# Patient Record
Sex: Male | Born: 1985 | Race: White | Hispanic: No | Marital: Married | Smoking: Former smoker
Health system: Southern US, Community
[De-identification: ages and names within clinical notes are randomized; demographics above are authoritative.]

## PROBLEM LIST (undated history)

## (undated) HISTORY — PX: OTHER SURGICAL HISTORY: SHX169

## (undated) HISTORY — PX: APPENDECTOMY: SHX54

---
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
## 2007-01-10 ENCOUNTER — Observation Stay (HOSPITAL_COMMUNITY): Admission: EM | Admit: 2007-01-10 | Discharge: 2007-01-11 | Payer: Self-pay | Admitting: Emergency Medicine

## 2008-12-28 ENCOUNTER — Emergency Department (HOSPITAL_COMMUNITY): Admission: EM | Admit: 2008-12-28 | Discharge: 2008-12-28 | Payer: Self-pay | Admitting: Emergency Medicine

## 2010-02-07 ENCOUNTER — Emergency Department (HOSPITAL_COMMUNITY): Admission: EM | Admit: 2010-02-07 | Discharge: 2010-02-07 | Payer: Self-pay | Admitting: Family Medicine

## 2010-02-26 ENCOUNTER — Emergency Department (HOSPITAL_COMMUNITY): Admission: EM | Admit: 2010-02-26 | Discharge: 2010-02-26 | Payer: Self-pay | Admitting: Emergency Medicine
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
## 2010-12-10 NOTE — Discharge Summary (Signed)
NAME:  Stephen [name], STANDING NO.:  [id number]   MEDICAL RECORD NO.:  [id number]          PATIENT TYPE:  INP   LOCATION:  A321                          FACILITY:  [facility]   PHYSICIAN:  Donna Bernard, M.D.DATE OF BIRTH:  1986/04/07   DATE OF ADMISSION:  12/24/2004  DATE OF DISCHARGE:  06/05/2006LH                                 DISCHARGE SUMMARY   DISCHARGE DIAGNOSIS:  Early appendicitis with mesenteric lymphadenitis.   DISPOSITION:  Discharged to home.   HISTORY OF PRESENT ILLNESS:  Please see H&P as dictated.   HOSPITAL COURSE:  This patient is a 25 year old, white male with history of  benign medical history.  He was doing well until the day prior to admission  when he developed acute abdominal pain.  He also had significant anorexia  and fever.  This was accompanied by significant vomiting.  His pain  localized to his right quadrant and I was consulted.  We admitted the  patient to the hospital.  His white blood count was 19,000 with forward  shift.  CT scan did not reveal exact status of the appendix due to the oral  contrast not reaching that site due to recurrent vomiting.  The patient was  given IV fluids and IV pain medicine.  Dr. Katrinka Blazing was consulted.  The patient  was kept NPO.  IV antibiotics of Cipro and Flagyl at appropriate doses were  administered.  Dr. Katrinka Blazing felt the patient likely had an appendicitis.  He  received an appendectomy as noted.  The appendix on pathological exam showed  evidence of early inflammation.  On the date of discharge, the patient was  feeling better.  His white blood count had normalized.  It was felt that he  was stable for discharge and was discharged home.  Diagnosis and disposition  as noted above.       WSL/MEDQ  D:  01/26/2005  T:  01/26/2005  Job:  [id number]

## 2010-12-10 NOTE — H&P (Signed)
NAME:  Stephen, [name] NO.:  [id number]   MEDICAL RECORD NO.:  [id number]          PATIENT TYPE:  INP   LOCATION:  A321                          FACILITY:  [facility]   PHYSICIAN:  Donna Bernard, M.D.DATE OF BIRTH:  Sep 07, 1985   DATE OF ADMISSION:  12/24/2004  DATE OF DISCHARGE:  LH                                HISTORY & PHYSICAL   CHIEF COMPLAINT:  Abdominal pain and vomiting.   SUBJECTIVE:  This patient is a 25 year old white male with a benign current  medical history, presented to the emergency room on the day of admission  with acute complaints.  The day prior to admission, the patient noted the  development of headache and abdominal pain.  Soon after that he began  developing vomiting.  He pretty much vomited off and on throughout the  night.  The morning of admission, he notes anorexia.  He has had no  diarrhea.  He has noted some low-grade fever with a maximum temperature  around 101.  The patient has had no back pain.  The initial headache has  dissipated.  The patient states he has no decent appetite and there has been  no one else sick at home.  No current medications.  The patient is currently  in school, finishing up the year.   ALLERGIES:  None known.   CURRENT MEDICATIONS:  No current medications.   SURGICAL HISTORY:  No prior surgeries.   FAMILY HISTORY:  Noncontributory.   REVIEW OF SYSTEMS:  Otherwise negative.   VITAL SIGNS:  Temperature 100.  The patient is alert, moderate malaise,  blood pressure 122/70.   PHYSICAL EXAMINATION:  HEENT:  Normal.  NECK:  Supple.  LUNGS:  Clear.  HEART:  Regular rate and rhythm.  ABDOMINAL EXAM:  Good bowel sounds, diffuse upper abdominal tenderness,  significant lower abdominal tenderness, no CVA tenderness.  EXTREMITIES:  Normal.  Pulse is good.   SIGNIFICANT LABORATORIES:  White blood count 19,000 with a left shift.  CT scan did not reveal exact status of appendix due to oral contrast not  reaching that site due to recurrent vomiting.   IMPRESSION:  Appendicitis versus a viral syndrome.   PLAN:  Surgery consult.  I had talked to rotation Dr. Katrinka Blazing.  IV fluids, IV  antibiotics, IV pain control, nausea control, symptomatic care.       WSL/MEDQ  D:  12/25/2004  T:  12/25/2004  Job:  [id number]
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
## 2010-12-10 NOTE — Op Note (Signed)
NAME:  Stephen [name], Stephen [name]              ACCOUNT NO.:  [id number]   MEDICAL RECORD NO.:  [id number]          PATIENT TYPE:  INP   LOCATION:  A321                          FACILITY:  [facility]   PHYSICIAN:  Dirk Dress. Katrinka Blazing, M.D.   DATE OF BIRTH:  [birth year]   DATE OF PROCEDURE:  DATE OF DISCHARGE:                                 OPERATIVE REPORT   PREOPERATIVE DIAGNOSIS:  Acute abdomen, rule out appendicitis.   POSTOPERATIVE DIAGNOSIS:  Mesenteric adenitis with probably early  appendicitis.   PROCEDURE:  Diagnostic laparoscopy with appendectomy.   SURGEON:  Dirk Dress. Katrinka Blazing, M.D.   DESCRIPTION:  Under general anesthesia, the patient's abdomen was prepped  and draped in the sterile field.  A supraumbilical midline incision was made  and the Veress needle was inserted uneventfully.  The abdomen was  insufflated with 2 L of CO2.  Using a Visiport guide, a 10 mm port was  placed.  The laparoscope was placed uneventfully.  Under videoscopic  guidance, a 5 mm port was placed in the suprapubic midline and a 12 mm port  was placed in the left lower quadrant.  The bowel appeared to be slightly  edematous and erythematous with most of the erythema appearing to be in the  terminal ileum.  There were large nodes in the ileocolic mesentery.  The  appendix was slightly engorged with increased vascularity and inflammation  of the serosa but without any purulence.  The mesoappendix was unremarkable.  The pelvis was unremarkable.  The upper abdomen appeared to be normal.  The  mesoappendix was dissected.  It was slightly thickened, so it was dissected  in three sections.  It was transected using the vascular Endo-GIA using  three fires.  The base of the appendix was transected using the vascular  Endo-GIA.  The staple line appeared to be intact.  The appendix was placed  in the EndoCatch device and retrieved.  Further irrigation was carried out  until the fluid was clear.  The pelvis was inspected and there was  some  small amount of bloody fluid, which was irrigated until clear.  No other  abnormality was noted.  The patient tolerated the procedure well.  CO2 was  allowed to escape from the abdomen and ports were removed.  The  supraumbilical incision was  closed with 0 Vicryl on the fascia.  All incisions were closed with staples  on the skin.  The patient tolerated the procedure well.  He was awakened  from anesthesia uneventfully, transferred to a bed and taken to the  postanesthetic care unit for monitoring.       LCS/MEDQ  D:  12/25/2004  T:  12/26/2004  Job:  [id number]   cc:   Donna Bernard, M.D.  [address]  [place]  [place] [address]  Fax: [phone]

## 2011-05-11 LAB — URINALYSIS, ROUTINE W REFLEX MICROSCOPIC
Glucose, UA: NEGATIVE
Leukocytes, UA: NEGATIVE
Nitrite: NEGATIVE
Protein, ur: 30 — AB
Specific Gravity, Urine: 1.03
Urobilinogen, UA: 1

## 2011-05-11 LAB — I-STAT 8, (EC8 V) (CONVERTED LAB)
Acid-base deficit: 1
BUN: 15
Glucose, Bld: 122 — ABNORMAL HIGH
HCT: 50
Hemoglobin: 17
Operator id: 277751
Sodium: 141

## 2011-05-11 LAB — URINE MICROSCOPIC-ADD ON

## 2011-05-11 LAB — POCT I-STAT CREATININE
Creatinine, Ser: 1.1
Operator id: 277751

## 2011-05-11 LAB — HEMOGLOBIN: Hemoglobin: 13.5

## 2012-02-18 ENCOUNTER — Emergency Department (HOSPITAL_COMMUNITY)
Admission: EM | Admit: 2012-02-18 | Discharge: 2012-02-18 | Disposition: A | Discharge status: Home / Self Care | Payer: BC Managed Care – PPO | Attending: Emergency Medicine | Admitting: Emergency Medicine

## 2012-02-18 ENCOUNTER — Encounter (HOSPITAL_COMMUNITY): Payer: Self-pay | Admitting: Emergency Medicine

## 2012-02-18 DIAGNOSIS — F17200 Nicotine dependence, unspecified, uncomplicated: Secondary | ICD-10-CM | POA: Insufficient documentation

## 2012-02-18 DIAGNOSIS — K089 Disorder of teeth and supporting structures, unspecified: Secondary | ICD-10-CM | POA: Insufficient documentation

## 2012-02-18 DIAGNOSIS — K0889 Other specified disorders of teeth and supporting structures: Secondary | ICD-10-CM

## 2012-02-18 MED ORDER — HYDROCODONE-ACETAMINOPHEN 5-325 MG PO TABS: 1.0000 | ORAL_TABLET | Freq: Four times a day (QID) | ORAL | Status: AC | PRN

## 2012-02-18 MED ORDER — PENICILLIN V POTASSIUM 500 MG PO TABS: 500.0000 mg | ORAL_TABLET | Freq: Four times a day (QID) | ORAL | Status: AC

## 2012-02-18 MED ORDER — OXYCODONE-ACETAMINOPHEN 5-325 MG PO TABS
1.0000 | ORAL_TABLET | Freq: Once | ORAL | Status: AC
  Administered 2012-02-18: 1 via ORAL
  Filled 2012-02-18: qty 1

## 2012-02-18 NOTE — ED Provider Notes (Cosign Needed)
History     CSN: [id number]  Arrival date & time 02/18/12  0327   First MD Initiated Contact with Patient 02/18/12 0421      Chief Complaint  Patient presents with  . Dental Pain    (Consider location/radiation/quality/duration/timing/severity/associated sxs/prior treatment) Patient is a 26 y.o. male presenting with tooth pain. The history is provided by the patient (pt complains of a toothache). No language interpreter was used.  Dental PainThe primary symptoms include mouth pain. Primary symptoms do not include dental injury, headaches or cough. The symptoms began more than 1 week ago. The symptoms are worsening. The symptoms are recurrent. The symptoms occur frequently.  Additional symptoms do not include: gum swelling and fatigue. Medical issues do not include: alcohol problem.    History reviewed. No pertinent past medical history.  Past Surgical History  Procedure Date  . Appendectomy   . Leg operation     History reviewed. No pertinent family history.  History  Substance Use Topics  . Smoking status: Current Some Day Smoker  . Smokeless tobacco: Not on file  . Alcohol Use: Yes     occasionally      Review of Systems  Constitutional: Negative for fatigue.  HENT: Negative for congestion, sinus pressure and ear discharge.        Toothache  Eyes: Negative for discharge.  Respiratory: Negative for cough.   Cardiovascular: Negative for chest pain.  Gastrointestinal: Negative for abdominal pain and diarrhea.  Genitourinary: Negative for frequency and hematuria.  Musculoskeletal: Negative for back pain.  Skin: Negative for rash.  Neurological: Negative for seizures and headaches.  Hematological: Negative.   Psychiatric/Behavioral: Negative for hallucinations.    Allergies  Review of patient's allergies indicates no known allergies.  Home Medications   Current Outpatient Rx  Name Route Sig Dispense Refill  . HYDROCODONE-ACETAMINOPHEN 5-325 MG PO TABS Oral  Take 1 tablet by mouth every 6 (six) hours as needed for pain. 20 tablet 0  . PENICILLIN V POTASSIUM 500 MG PO TABS Oral Take 1 tablet (500 mg total) by mouth 4 (four) times daily. 30 tablet 0    BP 130/83  Pulse 55  Temp 97.3 F (36.3 C) (Oral)  Resp 18  Ht 6\' 1"  (1.854 m)  Wt 195 lb (88.451 kg)  BMI 25.73 kg/m2  SpO2 100%  Physical Exam  Constitutional: He is oriented to person, place, and time. He appears well-developed.  HENT:  Head: Normocephalic.       Tender right lower molar  Eyes: Conjunctivae are normal.  Neck: No tracheal deviation present.  Cardiovascular:  No murmur heard. Musculoskeletal: Normal range of motion.  Neurological: He is oriented to person, place, and time.  Skin: Skin is warm.  Psychiatric: He has a normal mood and affect.    ED Course  Procedures (including critical care time)  Labs Reviewed - No data to display No results found.   1. Toothache       MDM          Benny Lennert, MD 02/18/12 [phone]

## 2012-02-18 NOTE — ED Notes (Signed)
Patient complaining of toothache. States has been bothering him for approximately 2 weeks. States cannot take the pain tonight.

## 2012-02-18 NOTE — ED Notes (Signed)
Pt discharged. Pt stable at time of discharge. Medications reviewed pt has no questions regarding discharge at this time. Pt voiced understanding of discharge instructions.  

## 2018-04-15 ENCOUNTER — Encounter (HOSPITAL_COMMUNITY): Payer: Self-pay | Admitting: Emergency Medicine

## 2018-04-15 ENCOUNTER — Emergency Department (HOSPITAL_COMMUNITY)
Admission: EM | Admit: 2018-04-15 | Discharge: 2018-04-15 | Disposition: A | Discharge status: Home / Self Care | Payer: BLUE CROSS/BLUE SHIELD | Attending: Emergency Medicine | Admitting: Emergency Medicine

## 2018-04-15 ENCOUNTER — Emergency Department (HOSPITAL_COMMUNITY): Payer: BLUE CROSS/BLUE SHIELD

## 2018-04-15 ENCOUNTER — Other Ambulatory Visit: Payer: Self-pay

## 2018-04-15 DIAGNOSIS — F17210 Nicotine dependence, cigarettes, uncomplicated: Secondary | ICD-10-CM | POA: Diagnosis not present

## 2018-04-15 DIAGNOSIS — M778 Other enthesopathies, not elsewhere classified: Secondary | ICD-10-CM

## 2018-04-15 DIAGNOSIS — M779 Enthesopathy, unspecified: Secondary | ICD-10-CM

## 2018-04-15 DIAGNOSIS — M70841 Other soft tissue disorders related to use, overuse and pressure, right hand: Secondary | ICD-10-CM | POA: Insufficient documentation

## 2018-04-15 DIAGNOSIS — Y93H3 Activity, building and construction: Secondary | ICD-10-CM | POA: Insufficient documentation

## 2018-04-15 DIAGNOSIS — M25531 Pain in right wrist: Secondary | ICD-10-CM | POA: Diagnosis present

## 2018-04-15 MED ORDER — IBUPROFEN 800 MG PO TABS
800.0000 mg | ORAL_TABLET | Freq: Once | ORAL | Status: AC
  Administered 2018-04-15: 800 mg via ORAL
  Filled 2018-04-15: qty 1

## 2018-04-15 MED ORDER — IBUPROFEN 600 MG PO TABS: 600.0000 mg | ORAL_TABLET | Freq: Three times a day (TID) | ORAL | 0 refills | Status: AC

## 2018-04-15 NOTE — Discharge Instructions (Signed)
I recommend using just ice for the next 2-3 days to help reduce swelling, after which you can use a heating pad for 20 minutes several times daily which can help to heal this tendon more quickly. Avoid any activity that worsens the pain or swelling and use the splint until better.  If your symptoms are not improving over the the next 10-14 days with this treatment, you should have further evaluation by an orthopedic specialist.  Dr. Ave Filterhandler is the orthopedic specialist on call for us.  If you prefer to see someone locally in [place], you may try calling Dr Romeo AppleHarrison.

## 2018-04-15 NOTE — ED Triage Notes (Signed)
Pt c/o R wrist pain and swelling for several days. Denies injuries.

## 2018-04-16 NOTE — ED Provider Notes (Signed)
[facility] EMERGENCY DEPARTMENT Provider Note   CSN: [id number] Arrival date & time: 04/15/18  2024     History   Chief Complaint Chief Complaint  Patient presents with  . Wrist Pain    HPI Stephen [name] is a 32 y.o.right handed male with no significant past medical history presenting with right wrist pain and swelling. He denies specific injury but describes possible overuse. 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  He has used ice and rest over the weekend with no improvement. His pain is worsened with wrist and finger flex/ext, with the pain being the worst with wrist extension and thumb and index movement.   The history is provided by the patient.    History reviewed. No pertinent past medical history.  There are no active problems to display for this patient.   Past Surgical History:  Procedure Laterality Date  . APPENDECTOMY    . leg operation          Home Medications    Prior to Admission medications   Medication Sig Start Date End Date Taking? Authorizing Provider  acetaminophen (TYLENOL) 500 MG tablet Take 1,000 mg by mouth every 6 (six) hours as needed for mild pain.   Yes [provider]  Melatonin 10 MG TABS Take 2 tablets by mouth at bedtime as needed (sleep).   Yes [provider]  ibuprofen (ADVIL,MOTRIN) 600 MG tablet Take 1 tablet (600 mg total) by mouth 3 (three) times daily. 04/15/18   Burgess AmorIdol, Zamaya Rapaport, PA-C    Family History History reviewed. No pertinent family history.  Social History Social History   Tobacco Use  . Smoking status: Current Some Day Smoker  . Smokeless tobacco: Current User    Types: Chew  Substance Use Topics  . Alcohol use: Yes    Comment: occasionally  . Drug use: Not Currently     Allergies   Patient has no known  allergies.   Review of Systems Review of Systems  Constitutional: Negative for fever.  Musculoskeletal: Positive for arthralgias. Negative for joint swelling and myalgias.  Neurological: Negative for weakness and numbness.     Physical Exam Updated Vital Signs BP (!) 147/110 (BP Location: Left Arm)   Pulse 79   Temp 98.1 F (36.7 C) (Oral)   Resp 18   Ht 6' (1.829 m)   Wt 98 kg   SpO2 100%   BMI 29.29 kg/m   Physical Exam  Constitutional: He appears well-developed and well-nourished.  HENT:  Head: Atraumatic.  Cardiovascular: Normal rate.  Pulses equal bilaterally  Pulmonary/Chest: Effort normal.  Musculoskeletal: He exhibits edema and tenderness.       Right wrist: He exhibits decreased range of motion, tenderness and swelling. He exhibits no effusion, no crepitus and no deformity.  ttp right dorsal wrist. Localized soft edema which is tender and located radial dorsal wrist along the distal radius. Pain with active and passive wrist and finger flex/ext. No erythema. Skin intact. Radial pulse full.   No crepitus with ROM.  Neurological: He is alert. He has normal strength. He displays normal reflexes. No sensory deficit.  Skin: Skin is warm and dry.  Psychiatric: He has a normal mood and affect.     ED Treatments / Results  Labs (all labs ordered are  listed, but only abnormal results are displayed) Labs Reviewed - No data to display  EKG None  Radiology Dg Wrist Complete Right  Result Date: 04/15/2018 CLINICAL DATA:  Right wrist pain and swelling. EXAM: RIGHT WRIST - COMPLETE 3+ VIEW COMPARISON:  None. FINDINGS: There is no evidence of fracture or dislocation. There is no evidence of arthropathy or other focal bone abnormality. Soft tissues are unremarkable. IMPRESSION: Negative. Electronically Signed   By: Kennith Center M.D.   On: 04/15/2018 21:01    Procedures Procedures (including critical care time)  Medications Ordered in ED Medications  ibuprofen  (ADVIL,MOTRIN) tablet 800 mg (800 mg Oral Given 04/15/18 2215)     Initial Impression / Assessment and Plan / ED Course  I have reviewed the triage vital signs and the nursing notes.  Pertinent labs & imaging results that were available during my care of the patient were reviewed by me and considered in my medical decision making (see chart for details).     Images reviewed and discussed with pt. Exam and h/o suggesting extensor tendinitis of right wrist/hand. Localized soft focal edema with distinct borders suggesting cyst like lesion/possible ganglion of the tendon or sheath.  He was placed in a velcro wrist splint to immobilize the site. Ice, elevation, ibuprofen. Referral to hand specialty for further eval/management if sx do not resolve with todays tx.  Pt understands and agrees with plan.  Final Clinical Impressions(s) / ED Diagnoses   Final diagnoses:  Tendinitis of extensor tendon of hand    ED Discharge Orders         Ordered    ibuprofen (ADVIL,MOTRIN) 600 MG tablet  3 times daily     04/15/18 2205           Burgess Amor, PA-C 04/16/18 1410    Samuel Jester, DO 04/18/18 1558

## 2020-01-26 ENCOUNTER — Encounter (HOSPITAL_COMMUNITY): Payer: Self-pay | Admitting: Emergency Medicine

## 2020-01-26 ENCOUNTER — Emergency Department (HOSPITAL_COMMUNITY)
Admission: EM | Admit: 2020-01-26 | Discharge: 2020-01-26 | Disposition: A | Discharge status: Home / Self Care | Payer: BC Managed Care – PPO | Attending: Emergency Medicine | Admitting: Emergency Medicine

## 2020-01-26 ENCOUNTER — Emergency Department (HOSPITAL_COMMUNITY): Payer: BC Managed Care – PPO

## 2020-01-26 DIAGNOSIS — G5622 Lesion of ulnar nerve, left upper limb: Secondary | ICD-10-CM

## 2020-01-26 DIAGNOSIS — R202 Paresthesia of skin: Secondary | ICD-10-CM | POA: Diagnosis present

## 2020-01-26 DIAGNOSIS — F17210 Nicotine dependence, cigarettes, uncomplicated: Secondary | ICD-10-CM | POA: Diagnosis not present

## 2020-01-26 DIAGNOSIS — R791 Abnormal coagulation profile: Secondary | ICD-10-CM | POA: Diagnosis not present

## 2020-01-26 DIAGNOSIS — Z79899 Other long term (current) drug therapy: Secondary | ICD-10-CM | POA: Insufficient documentation

## 2020-01-26 LAB — DIFFERENTIAL
Abs Immature Granulocytes: 0.04 10*3/uL (ref 0.00–0.07)
Basophils Absolute: 0.1 10*3/uL (ref 0.0–0.1)
Basophils Relative: 1 %
Eosinophils Absolute: 0.3 10*3/uL (ref 0.0–0.5)
Eosinophils Relative: 2 %
Immature Granulocytes: 0 %
Lymphocytes Relative: 29 %
Lymphs Abs: 3.7 10*3/uL (ref 0.7–4.0)
Monocytes Absolute: 1 10*3/uL (ref 0.1–1.0)
Monocytes Relative: 8 %
Neutro Abs: 7.9 10*3/uL — ABNORMAL HIGH (ref 1.7–7.7)
Neutrophils Relative %: 60 %

## 2020-01-26 LAB — I-STAT CHEM 8, ED
BUN: 17 mg/dL (ref 6–20)
Calcium, Ion: 1.24 mmol/L (ref 1.15–1.40)
Chloride: 105 mmol/L (ref 98–111)
Creatinine, Ser: 1.3 mg/dL — ABNORMAL HIGH (ref 0.61–1.24)
Glucose, Bld: 107 mg/dL — ABNORMAL HIGH (ref 70–99)
HCT: 50 % (ref 39.0–52.0)
Hemoglobin: 17 g/dL (ref 13.0–17.0)
Potassium: 4.1 mmol/L (ref 3.5–5.1)
Sodium: 142 mmol/L (ref 135–145)
TCO2: 27 mmol/L (ref 22–32)

## 2020-01-26 LAB — COMPREHENSIVE METABOLIC PANEL
ALT: 54 U/L — ABNORMAL HIGH (ref 0–44)
AST: 44 U/L — ABNORMAL HIGH (ref 15–41)
Albumin: 4.5 g/dL (ref 3.5–5.0)
Alkaline Phosphatase: 70 U/L (ref 38–126)
Anion gap: 13 (ref 5–15)
BUN: 14 mg/dL (ref 6–20)
CO2: 22 mmol/L (ref 22–32)
Calcium: 10 mg/dL (ref 8.9–10.3)
Chloride: 105 mmol/L (ref 98–111)
Creatinine, Ser: 1.21 mg/dL (ref 0.61–1.24)
GFR calc Af Amer: >60 mL/min (ref >60)
GFR calc non Af Amer: >60 mL/min (ref >60)
Glucose, Bld: 111 mg/dL — ABNORMAL HIGH (ref 70–99)
Potassium: 4.4 mmol/L (ref 3.5–5.1)
Sodium: 140 mmol/L (ref 135–145)
Total Bilirubin: 0.8 mg/dL (ref 0.3–1.2)
Total Protein: 7.6 g/dL (ref 6.5–8.1)

## 2020-01-26 LAB — CBC
HCT: 52 % (ref 39.0–52.0)
Hemoglobin: 17.2 g/dL — ABNORMAL HIGH (ref 13.0–17.0)
MCH: 30.7 pg (ref 26.0–34.0)
MCHC: 33.1 g/dL (ref 30.0–36.0)
MCV: 92.9 fL (ref 80.0–100.0)
Platelets: 353 10*3/uL (ref 150–400)
RBC: 5.6 MIL/uL (ref 4.22–5.81)
RDW: 11.5 % (ref 11.5–15.5)
WBC: 13 10*3/uL — ABNORMAL HIGH (ref 4.0–10.5)
nRBC: 0 % (ref 0.0–0.2)

## 2020-01-26 LAB — PROTIME-INR
INR: 1 (ref 0.8–1.2)
Prothrombin Time: 13.2 seconds (ref 11.4–15.2)

## 2020-01-26 LAB — APTT: aPTT: 32 seconds (ref 24–36)

## 2020-01-26 MED ORDER — IBUPROFEN 400 MG PO TABS
600.0000 mg | ORAL_TABLET | Freq: Once | ORAL | Status: AC
  Administered 2020-01-26: 600 mg via ORAL
  Filled 2020-01-26: qty 1

## 2020-01-26 NOTE — ED Triage Notes (Signed)
Pt arrives POV for eval of L forearm weakness and L hand numbness, acute onset at 1530 this afternoon. Pt reports hx of longstanding L elbow injury, but states that he has never felt the pain "this far down in his arm". Reports fingers are tingling and when he makes a fist the pain shoots "all the way up to his shoulderblade". No appreciable weakness, facial droop, arm drift or neuro deficits on exam in triage. MD Criss Alvine examined in triage and determined NO code stroke at time of triage.

## 2020-01-26 NOTE — ED Provider Notes (Signed)
[facility] EMERGENCY DEPARTMENT Provider Note   CSN: [id number] Arrival date & time: 01/26/20  1754     History Chief Complaint  Patient presents with  . Extremity Weakness    Stephen [name] is a 34 y.o. male.  HPI 34 year old male presents with left elbow pain and tingling/numbness in his arm.  This has been ongoing for about a month or more.  Seem to be a lot worse today which is what prompted the ER visit.  Because of the numbness/tingling, a stroke work-up was started by triage.  The patient endorses pain shooting from his elbow down to his fingers mostly on the ulnar side.  There is also tingling/numbness.  Today however it is also shooting up towards his elbow.  No neck pain or specific shoulder pain.  No other symptoms anywhere else.  The tingling and numbness in his hand and elbow pain have been ongoing for a month or so and seems to come and go.  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  He has been dropping things out of his hand.  History reviewed. No pertinent past medical history.  There are no problems to display for this patient.   Past Surgical History:  Procedure Laterality Date  . APPENDECTOMY    . leg operation         History reviewed. No pertinent family history.  Social History   Tobacco Use  . Smoking status: Current Some Day Smoker  . Smokeless tobacco: Current User    Types: Chew  Substance Use Topics  . Alcohol use: Yes    Comment: occasionally  . Drug use: Not Currently    Home Medications Prior to Admission medications   Medication Sig Start Date End Date Taking? Authorizing Provider  acetaminophen (TYLENOL) 500 MG tablet Take 1,000 mg by mouth every 6 (six) hours as needed for mild pain.    [provider]  ibuprofen (ADVIL,MOTRIN) 600 MG tablet Take 1 tablet (600 mg total) by mouth 3 (three) times daily. 04/15/18   Burgess Amor, PA-C  Melatonin 10 MG TABS Take 2 tablets by  mouth at bedtime as needed (sleep).    [provider]    Allergies    Patient has no known allergies.  Review of Systems   Review of Systems  Musculoskeletal: Positive for arthralgias. Negative for joint swelling and neck pain.  Neurological: Positive for weakness and numbness. Negative for headaches.  All other systems reviewed and are negative.   Physical Exam Updated Vital Signs BP (!) 154/110 (BP Location: Right Arm)   Pulse 91   Temp 97.9 F (36.6 C) (Oral)   Resp 16   Ht 6' (1.829 m)   Wt 99 kg   SpO2 97%   BMI 29.60 kg/m   Physical Exam Vitals and nursing note reviewed.  Constitutional:      Appearance: He is well-developed.  HENT:     Head: Normocephalic and atraumatic.     Right Ear: External ear normal.     Left Ear: External ear normal.     Nose: Nose normal.  Eyes:     General:        Right eye: No discharge.        Left eye: No discharge.  Cardiovascular:     Rate and Rhythm: Normal rate and regular rhythm.     Pulses:          Radial pulses are  2+ on the left side.  Pulmonary:     Effort: Pulmonary effort is normal.  Abdominal:     Palpations: Abdomen is soft.     Tenderness: There is no abdominal tenderness.  Musculoskeletal:     Left shoulder: No tenderness or bony tenderness.     Left elbow: No swelling. Normal range of motion. Tenderness present.     Cervical back: Neck supple.     Comments: Patient reports tingling in the palmar left ulnar fingers/palm. Normal strength on exam.  Skin:    General: Skin is warm and dry.  Neurological:     Mental Status: He is alert.  Psychiatric:        Mood and Affect: Mood is not anxious.     ED Results / Procedures / Treatments   Labs (all labs ordered are listed, but only abnormal results are displayed) Labs Reviewed  CBC - Abnormal; Notable for the following components:      Result Value   WBC 13.0 (*)    Hemoglobin 17.2 (*)    All other components within normal limits    DIFFERENTIAL - Abnormal; Notable for the following components:   Neutro Abs 7.9 (*)    All other components within normal limits  COMPREHENSIVE METABOLIC PANEL - Abnormal; Notable for the following components:   Glucose, Bld 111 (*)    AST 44 (*)    ALT 54 (*)    All other components within normal limits  I-STAT CHEM 8, ED - Abnormal; Notable for the following components:   Creatinine, Ser 1.30 (*)    Glucose, Bld 107 (*)    All other components within normal limits  PROTIME-INR  APTT  CBG MONITORING, ED    EKG EKG Interpretation  Date/Time:  Sunday January 26 2020 18:03:31 EDT Ventricular Rate:  95 PR Interval:  94 QRS Duration: 92 QT Interval:  334 QTC Calculation: 419 R Axis:   53 Text Interpretation: Sinus rhythm with marked sinus arrhythmia with short PR no acute ST/T changes Confirmed by Pricilla Loveless [phone]) on 01/26/2020 7:18:07 PM   Radiology DG Elbow Complete Left  Result Date: 01/26/2020 CLINICAL DATA:  Left elbow pain. EXAM: LEFT ELBOW - COMPLETE 3+ VIEW COMPARISON:  Elbow radiographs dated 05/31/2004. FINDINGS: There is no evidence of fracture, dislocation, or joint effusion. There is a tiny enthesophyte on the olecranon. Soft tissues are unremarkable. IMPRESSION: Tiny enthesophyte on the olecranon. No acute findings. Electronically Signed   By: Romona Curls M.D.   On: 01/26/2020 20:21   CT HEAD WO CONTRAST  Result Date: 01/26/2020 CLINICAL DATA:  34 year old male with neurologic deficit and left arm numbness. EXAM: CT HEAD WITHOUT CONTRAST TECHNIQUE: Contiguous axial images were obtained from the base of the skull through the vertex without intravenous contrast. COMPARISON:  Head CT report dated 01/10/2007. FINDINGS: Brain: The ventricles and sulci appropriate size for patient's age. The gray-white matter discrimination is preserved. There is no acute intracranial hemorrhage. No mass effect or midline shift. No extra-axial fluid collection. Vascular: No hyperdense  vessel or unexpected calcification. Skull: Normal. Negative for fracture or focal lesion. Sinuses/Orbits: No acute finding. Other: None IMPRESSION: Unremarkable noncontrast CT of the brain. Electronically Signed   By: Elgie Collard M.D.   On: 01/26/2020 19:05    Procedures Procedures (including critical care time)  Medications Ordered in ED Medications  ibuprofen (ADVIL) tablet 600 mg (600 mg Oral Given 01/26/20 1959)    ED Course  I have reviewed the triage vital  signs and the nursing notes.  Pertinent labs & imaging results that were available during my care of the patient were reviewed by me and considered in my medical decision making (see chart for details).    MDM Rules/Calculators/A&P                          Patient likely is having ulnar entrapment symptoms.  Small enthesophyte but otherwise benign x-ray.  He had had a stroke work-up by triage but this seems to be a peripheral problem.  These labs and CT images were reviewed and are benign.He does have mild LFT abnormalities and states he drinks a fair amount and he was advised to cut back.  Otherwise, we have discussed some conservative measures and will have him follow-up with orthopedics. Final Clinical Impression(s) / ED Diagnoses Final diagnoses:  Ulnar neuropathy at elbow of left upper extremity    Rx / DC Orders ED Discharge Orders    None       Pricilla Loveless, MD 01/26/20 2039

## 2020-02-21 ENCOUNTER — Encounter: Payer: Self-pay | Admitting: Orthopedic Surgery

## 2020-02-21 ENCOUNTER — Ambulatory Visit: Payer: BC Managed Care – PPO | Admitting: Orthopedic Surgery

## 2020-02-21 ENCOUNTER — Telehealth: Payer: Self-pay | Admitting: Orthopedic Surgery

## 2020-02-21 NOTE — Telephone Encounter (Signed)
Patient did not attend appointment following Emergency room visit; letter sent.

## 2020-05-17 ENCOUNTER — Other Ambulatory Visit: Payer: Self-pay

## 2020-05-17 ENCOUNTER — Emergency Department (HOSPITAL_COMMUNITY)
Admission: EM | Admit: 2020-05-17 | Discharge: 2020-05-17 | Disposition: A | Discharge status: Home / Self Care | Payer: BC Managed Care – PPO | Attending: Emergency Medicine | Admitting: Emergency Medicine

## 2020-05-17 ENCOUNTER — Encounter (HOSPITAL_COMMUNITY): Payer: Self-pay

## 2020-05-17 DIAGNOSIS — R059 Cough, unspecified: Secondary | ICD-10-CM | POA: Diagnosis present

## 2020-05-17 DIAGNOSIS — F17200 Nicotine dependence, unspecified, uncomplicated: Secondary | ICD-10-CM | POA: Diagnosis not present

## 2020-05-17 DIAGNOSIS — Z20822 Contact with and (suspected) exposure to covid-19: Secondary | ICD-10-CM | POA: Diagnosis not present

## 2020-05-17 DIAGNOSIS — J020 Streptococcal pharyngitis: Secondary | ICD-10-CM | POA: Insufficient documentation

## 2020-05-17 DIAGNOSIS — I10 Essential (primary) hypertension: Secondary | ICD-10-CM

## 2020-05-17 DIAGNOSIS — J029 Acute pharyngitis, unspecified: Secondary | ICD-10-CM

## 2020-05-17 LAB — GROUP A STREP BY PCR: Group A Strep by PCR: DETECTED — AB

## 2020-05-17 LAB — RESPIRATORY PANEL BY RT PCR (FLU A&B, COVID)
Influenza A by PCR: NEGATIVE
Influenza B by PCR: NEGATIVE
SARS Coronavirus 2 by RT PCR: NEGATIVE

## 2020-05-17 MED ORDER — HYDROCHLOROTHIAZIDE 12.5 MG PO TABS: 12.5000 mg | ORAL_TABLET | Freq: Every day | ORAL | 1 refills | Status: AC

## 2020-05-17 MED ORDER — AMOXICILLIN 500 MG PO CAPS
500.0000 mg | ORAL_CAPSULE | Freq: Two times a day (BID) | ORAL | 0 refills | Status: AC
Start: 2020-05-17 — End: 2020-05-27

## 2020-05-17 NOTE — ED Triage Notes (Signed)
Pt complains of cough, sore throat and body aches since yesterday and SOB this morning. NAD.

## 2020-05-17 NOTE — Discharge Instructions (Addendum)
Your testing shows that you do have strep throat, your Covid test will result later today and you will be contacted if it is positive, if you have my chart it will come directly to your phone.  Your testing shows that you have strep throat so you will need to have antibiotics, amoxicillin has been prescribed to your pharmacy, take this twice daily for the next 10 days. Consider yourself contagious for the next 48 hours.  Tylenol or ibuprofen for pain  Your blood pressure was elevated to a point where you likely need to start some medication.  Please take the medication in the morning, I have sent it to your pharmacy.  You take 1 tablet/day.  You will need to have your blood pressure rechecked within 1 to 2 weeks, I would recommend that you do this at a doctor's office.  You may not need to be on medication long-term however given the elevation of the blood pressure today it will be important to start it.  If your symptoms get worse please return to the emergency department immediately

## 2020-05-17 NOTE — ED Provider Notes (Signed)
[name] EMERGENCY DEPARTMENT Provider Note   CSN: [id number] Arrival date & time: 05/17/20  0109     History Chief Complaint  Patient presents with  . Cough    Stephen [name] is a 34 y.o. male.  HPI   This patient is a 34 year old male, he has no significant chronic medical history and other than taking the occasional over-the-counter anti-inflammatory he really does not take any daily medicines.  After having a sore throat that started yesterday.  This has been persistent through today and feels like he might have a bit of a cough and shortness of breath today.  He denies fevers or chills and has had no nausea vomiting or diarrhea and in fact has had no loss of taste or smell.  He denies any sick contacts, he has 2 children at home neither of who are sick.  He does report that he has not taken any medications for this illness.  History reviewed. No pertinent past medical history.  There are no problems to display for this patient.   Past Surgical History:  Procedure Laterality Date  . APPENDECTOMY    . leg operation         History reviewed. No pertinent family history.  Social History   Tobacco Use  . Smoking status: Current Some Day Smoker  . Smokeless tobacco: Current User    Types: Chew  Substance Use Topics  . Alcohol use: Yes    Comment: occasionally  . Drug use: Not Currently    Home Medications Prior to Admission medications   Medication Sig Start Date End Date Taking? Authorizing Provider  acetaminophen (TYLENOL) 500 MG tablet Take 1,000 mg by mouth every 6 (six) hours as needed for mild pain.    [provider]  amoxicillin (AMOXIL) 500 MG capsule Take 1 capsule (500 mg total) by mouth 2 (two) times daily for 10 days. 05/17/20 05/27/20  Eber Hong, MD  hydrochlorothiazide (HYDRODIURIL) 12.5 MG tablet Take 1 tablet (12.5 mg total) by mouth daily. 05/17/20   Eber Hong, MD  ibuprofen (ADVIL,MOTRIN) 600 MG tablet Take 1 tablet  (600 mg total) by mouth 3 (three) times daily. 04/15/18   Burgess Amor, PA-C  Melatonin 10 MG TABS Take 2 tablets by mouth at bedtime as needed (sleep).    [provider]    Allergies    Patient has no known allergies.  Review of Systems   Review of Systems  HENT: Positive for sore throat.   Respiratory: Positive for shortness of breath.   Gastrointestinal: Negative for diarrhea, nausea and vomiting.  Neurological: Positive for headaches ( chronic).    Physical Exam Updated Vital Signs BP (!) 141/108   Pulse 85   Temp 99 F (37.2 C) (Oral)   Resp 16   Ht 1.854 m (6\' 1" )   Wt 95.3 kg   SpO2 100%   BMI 27.71 kg/m   Physical Exam Vitals and nursing note reviewed.  Constitutional:      Appearance: He is well-developed. He is not diaphoretic.  HENT:     Head: Normocephalic and atraumatic.     Mouth/Throat:     Pharynx: Oropharyngeal exudate and posterior oropharyngeal erythema present.     Comments: Normal phonation, no trismus or torticollis, he has bilateral tonsillar hypertrophy with mild exudative spots on each of the tonsils.  Uvula is midline Eyes:     General:        Right eye: No discharge.  Left eye: No discharge.     Conjunctiva/sclera: Conjunctivae normal.  Cardiovascular:     Rate and Rhythm: Normal rate and regular rhythm.     Pulses: Normal pulses.     Heart sounds: No murmur heard.   Pulmonary:     Effort: Pulmonary effort is normal. No respiratory distress.  Musculoskeletal:     Cervical back: Normal range of motion.  Lymphadenopathy:     Cervical: No cervical adenopathy.  Skin:    General: Skin is warm and dry.     Findings: No erythema or rash.  Neurological:     Mental Status: He is alert.     Coordination: Coordination normal.     ED Results / Procedures / Treatments   Labs (all labs ordered are listed, but only abnormal results are displayed) Labs Reviewed  GROUP A STREP BY PCR - Abnormal; Notable for the following  components:      Result Value   Group A Strep by PCR DETECTED (*)    All other components within normal limits  RESPIRATORY PANEL BY RT PCR (FLU A&B, COVID)    EKG None  Radiology No results found.  Procedures Procedures (including critical care time)  Medications Ordered in ED Medications - No data to display  ED Course  I have reviewed the triage vital signs and the nursing notes.  Pertinent labs & imaging results that were available during my care of the patient were reviewed by me and considered in my medical decision making (see chart for details).  Clinical Course as of May 18 704  Wynelle Link May 17, 2020  7564 The patient has strep, prescription given, stable for discharge   [BM]    Clinical Course User Index [BM] Eber Hong, MD   MDM Rules/Calculators/A&P                          This patient is well-appearing with signs of what appears to be a viral pharyngitis.  His lungs are totally clear, he is afebrile and not tachycardic with an oxygen of 100%.  I have swabbed him personally for strep throat, the nurse will check for Covid, the patient is otherwise stable appearing and I anticipate discharge.  There is no hepatosplenomegaly, the patient is agreeable to the plan  Of note the patient was hypertensive at 150/114, he does report that his family member has told him that he has had some hypertension in the past as well though he does not follow with a family doctor and does not take any daily medicines.  I have encouraged the patient to work on lifestyle management including intake of food and fluids exercise and healthy living to address this however given his diastolic range he likely needs to be started on medication.  Final Clinical Impression(s) / ED Diagnoses Final diagnoses:  Acute pharyngitis, unspecified etiology  Essential hypertension  Strep throat    Rx / DC Orders ED Discharge Orders         Ordered    hydrochlorothiazide (HYDRODIURIL) 12.5 MG tablet   Daily        05/17/20 0838    amoxicillin (AMOXIL) 500 MG capsule  2 times daily        05/17/20 0932           Eber Hong, MD 05/18/20 [phone]

## 2020-12-06 ENCOUNTER — Other Ambulatory Visit: Payer: Self-pay

## 2020-12-06 ENCOUNTER — Emergency Department (HOSPITAL_COMMUNITY): Payer: BC Managed Care – PPO

## 2020-12-06 ENCOUNTER — Encounter (HOSPITAL_COMMUNITY): Payer: Self-pay | Admitting: Emergency Medicine

## 2020-12-06 ENCOUNTER — Emergency Department (HOSPITAL_COMMUNITY)
Admission: EM | Admit: 2020-12-06 | Discharge: 2020-12-07 | Disposition: A | Discharge status: Home / Self Care | Payer: BC Managed Care – PPO | Attending: Emergency Medicine | Admitting: Emergency Medicine

## 2020-12-06 DIAGNOSIS — R0602 Shortness of breath: Secondary | ICD-10-CM | POA: Diagnosis not present

## 2020-12-06 DIAGNOSIS — Z87891 Personal history of nicotine dependence: Secondary | ICD-10-CM | POA: Insufficient documentation

## 2020-12-06 DIAGNOSIS — R072 Precordial pain: Secondary | ICD-10-CM | POA: Diagnosis not present

## 2020-12-06 DIAGNOSIS — Z79899 Other long term (current) drug therapy: Secondary | ICD-10-CM | POA: Insufficient documentation

## 2020-12-06 DIAGNOSIS — I10 Essential (primary) hypertension: Secondary | ICD-10-CM | POA: Diagnosis not present

## 2020-12-06 LAB — CBC
HCT: 52 % (ref 39.0–52.0)
Hemoglobin: 17.1 g/dL — ABNORMAL HIGH (ref 13.0–17.0)
MCH: 30.3 pg (ref 26.0–34.0)
MCHC: 32.9 g/dL (ref 30.0–36.0)
MCV: 92.2 fL (ref 80.0–100.0)
Platelets: 261 10*3/uL (ref 150–400)
RBC: 5.64 MIL/uL (ref 4.22–5.81)
RDW: 11.6 % (ref 11.5–15.5)
WBC: 8.3 10*3/uL (ref 4.0–10.5)
nRBC: 0 % (ref 0.0–0.2)

## 2020-12-06 LAB — BASIC METABOLIC PANEL
Anion gap: 7 (ref 5–15)
BUN: 13 mg/dL (ref 6–20)
CO2: 24 mmol/L (ref 22–32)
Calcium: 9.2 mg/dL (ref 8.9–10.3)
Chloride: 105 mmol/L (ref 98–111)
Creatinine, Ser: 0.99 mg/dL (ref 0.61–1.24)
GFR, Estimated: >60 mL/min (ref >60)
Glucose, Bld: 122 mg/dL — ABNORMAL HIGH (ref 70–99)
Potassium: 3.4 mmol/L — ABNORMAL LOW (ref 3.5–5.1)
Sodium: 136 mmol/L (ref 135–145)

## 2020-12-06 LAB — TROPONIN I (HIGH SENSITIVITY): Troponin I (High Sensitivity): <2 ng/L (ref <18)

## 2020-12-06 NOTE — ED Provider Notes (Signed)
[facility] EMERGENCY DEPARTMENT Provider Note   CSN: [id number] Arrival date & time: 12/06/20  2127     History Chief Complaint  Patient presents with  . Chest Pain    Stephen [name] is a 35 y.o. male.  Patient with acute onset of anterior chest pain radiating to the left jaw area that started 2 hours prior to arrival.  Associated with shortness of breath.  Patient has a history of hypertension.  Had a recent change in his blood pressure medicines.  But he has been taking them.  Patient stated onset of pain and he said his blood pressure shot up because he checked it.  This occurred while he was driving.  Has had episodes of this happen before.  But this 1 was more severe lasting longer.  Is not a supersharp pain now its more of a kind of discomfort.  It does hurt to take a deep breath.  Is associated with shortness of breath.  No real pain with movement of his arms.  Pain does not radiate to his back.  No leg swelling.  Past medical history significant for history of hypertension negative for diabetes used to be a smoker.  Family history that he knows of is negative for any premature coronary artery disease.        History reviewed. No pertinent past medical history.  There are no problems to display for this patient.   Past Surgical History:  Procedure Laterality Date  . APPENDECTOMY    . leg operation         No family history on file.  Social History   Tobacco Use  . Smoking status: Former Games developer  . Smokeless tobacco: Current User    Types: Chew  Substance Use Topics  . Alcohol use: Yes    Comment: occasionally  . Drug use: Not Currently    Home Medications Prior to Admission medications   Medication Sig Start Date End Date Taking? Authorizing Provider  acetaminophen (TYLENOL) 500 MG tablet Take 1,000 mg by mouth every 6 (six) hours as needed for mild pain.    [provider]  hydrochlorothiazide (HYDRODIURIL) 12.5 MG tablet Take 1 tablet (12.5 mg  total) by mouth daily. 05/17/20   Eber Hong, MD  ibuprofen (ADVIL,MOTRIN) 600 MG tablet Take 1 tablet (600 mg total) by mouth 3 (three) times daily. 04/15/18   Burgess Amor, PA-C  Melatonin 10 MG TABS Take 2 tablets by mouth at bedtime as needed (sleep).    [provider]    Allergies    Mushroom extract complex  Review of Systems   Review of Systems  Constitutional: Negative for chills and fever.  HENT: Negative for rhinorrhea and sore throat.   Eyes: Negative for visual disturbance.  Respiratory: Positive for shortness of breath. Negative for cough.   Cardiovascular: Positive for chest pain. Negative for leg swelling.  Gastrointestinal: Negative for abdominal pain, diarrhea, nausea and vomiting.  Genitourinary: Negative for dysuria.  Musculoskeletal: Negative for back pain and neck pain.  Skin: Negative for rash.  Neurological: Negative for dizziness, light-headedness and headaches.  Hematological: Does not bruise/bleed easily.  Psychiatric/Behavioral: Negative for confusion.    Physical Exam Updated Vital Signs BP 129/90   Pulse 82   Temp 98.2 F (36.8 C)   Resp 12   Ht 1.854 m (6\' 1" )   Wt 95.3 kg   SpO2 99%   BMI 27.72 kg/m   Physical Exam Vitals and nursing note reviewed.  Constitutional:  General: He is not in acute distress.    Appearance: Normal appearance. He is well-developed. He is not ill-appearing.  HENT:     Head: Normocephalic and atraumatic.  Eyes:     Extraocular Movements: Extraocular movements intact.     Conjunctiva/sclera: Conjunctivae normal.     Pupils: Pupils are equal, round, and reactive to light.  Cardiovascular:     Rate and Rhythm: Normal rate and regular rhythm.     Heart sounds: No murmur heard.   Pulmonary:     Effort: Pulmonary effort is normal. No respiratory distress.     Breath sounds: Normal breath sounds.     Comments: Chest is tender to palpation left and right anterior chest. Chest:     Chest wall:  Tenderness present.  Abdominal:     General: There is no distension.     Palpations: Abdomen is soft.     Tenderness: There is no abdominal tenderness.  Musculoskeletal:        General: No swelling.     Cervical back: Neck supple.  Skin:    General: Skin is warm and dry.     Capillary Refill: Capillary refill takes less than 2 seconds.  Neurological:     General: No focal deficit present.     Mental Status: He is alert and oriented to person, place, and time.     Cranial Nerves: No cranial nerve deficit.     Sensory: No sensory deficit.     Motor: No weakness.     ED Results / Procedures / Treatments   Labs (all labs ordered are listed, but only abnormal results are displayed) Labs Reviewed  BASIC METABOLIC PANEL - Abnormal; Notable for the following components:      Result Value   Potassium 3.4 (*)    Glucose, Bld 122 (*)    All other components within normal limits  CBC - Abnormal; Notable for the following components:   Hemoglobin 17.1 (*)    All other components within normal limits  D-DIMER, QUANTITATIVE  TROPONIN I (HIGH SENSITIVITY)  TROPONIN I (HIGH SENSITIVITY)    EKG EKG Interpretation  Date/Time:  Sunday Dec 06 2020 21:32:40 EDT Ventricular Rate:  87 PR Interval:  130 QRS Duration: 98 QT Interval:  352 QTC Calculation: 423 R Axis:   63 Text Interpretation: Normal sinus rhythm Normal ECG Confirmed by Vanetta Mulders [phone]) on 12/06/2020 10:22:00 PM   Radiology No results found.  Procedures Procedures   Medications Ordered in ED Medications - No data to display  ED Course  I have reviewed the triage vital signs and the nursing notes.  Pertinent labs & imaging results that were available during my care of the patient were reviewed by me and considered in my medical decision making (see chart for details).    MDM Rules/Calculators/A&P                          Chest palpation anterior suggestive chest wall pain.  Patient does have shortness  of breath.  Doubt pulmonary embolus also could go along with this.  EKG without any acute changes.  First troponin normal but does need delta troponin based on the onset of the pain.  No leukocytosis hemoglobin a little hemoconcentrated at 17.1.  Basic metabolic panel without any significant abnormalities other than some mild hypokalemia at 3.4.  Renal functions normal.  Based on the chest wall tenderness and the shortness of breath although not tachycardic and  no hypoxia.  Have ordered D-dimer.  And also will need delta troponin.  Formal read of chest x-ray is pending.  Elevation of the left diaphragm.  There may be some evidence of hiatal hernia perhaps.  Patient's abdomen was completely nontender to exam.      Final Clinical Impression(s) / ED Diagnoses Final diagnoses:  Precordial pain    Rx / DC Orders ED Discharge Orders    None       Vanetta Mulders, MD 12/06/20 2334

## 2020-12-06 NOTE — ED Notes (Signed)
Pt A&Ox3. States 2 days ago his HTN medication was changed; he is unsure of which one he takes. Denies this type of pain before, however states "sometimes when I'm driving I can tell my blood pressure shoots up." POC has been discussed with the pt.

## 2020-12-06 NOTE — ED Notes (Signed)
Pt taken for xray

## 2020-12-06 NOTE — ED Triage Notes (Signed)
Pt c/o generalized chest pain that radiates to his left arm and left jaw, with c/o nausea as well that started x 2 hours ago.

## 2020-12-07 LAB — D-DIMER, QUANTITATIVE: D-Dimer, Quant: <0.27 ug/mL-FEU (ref 0.00–0.50)

## 2020-12-07 LAB — TROPONIN I (HIGH SENSITIVITY): Troponin I (High Sensitivity): <2 ng/L (ref <18)

## 2021-07-18 IMAGING — DX DG CHEST 2V
2 series · 2 of 2 positions shown · non-contrast
Comparison: Prior radiograph from 01/10/2007.

CLINICAL DATA: Initial evaluation for acute chest pain.

EXAM:
CHEST - 2 VIEW

[chest pa]
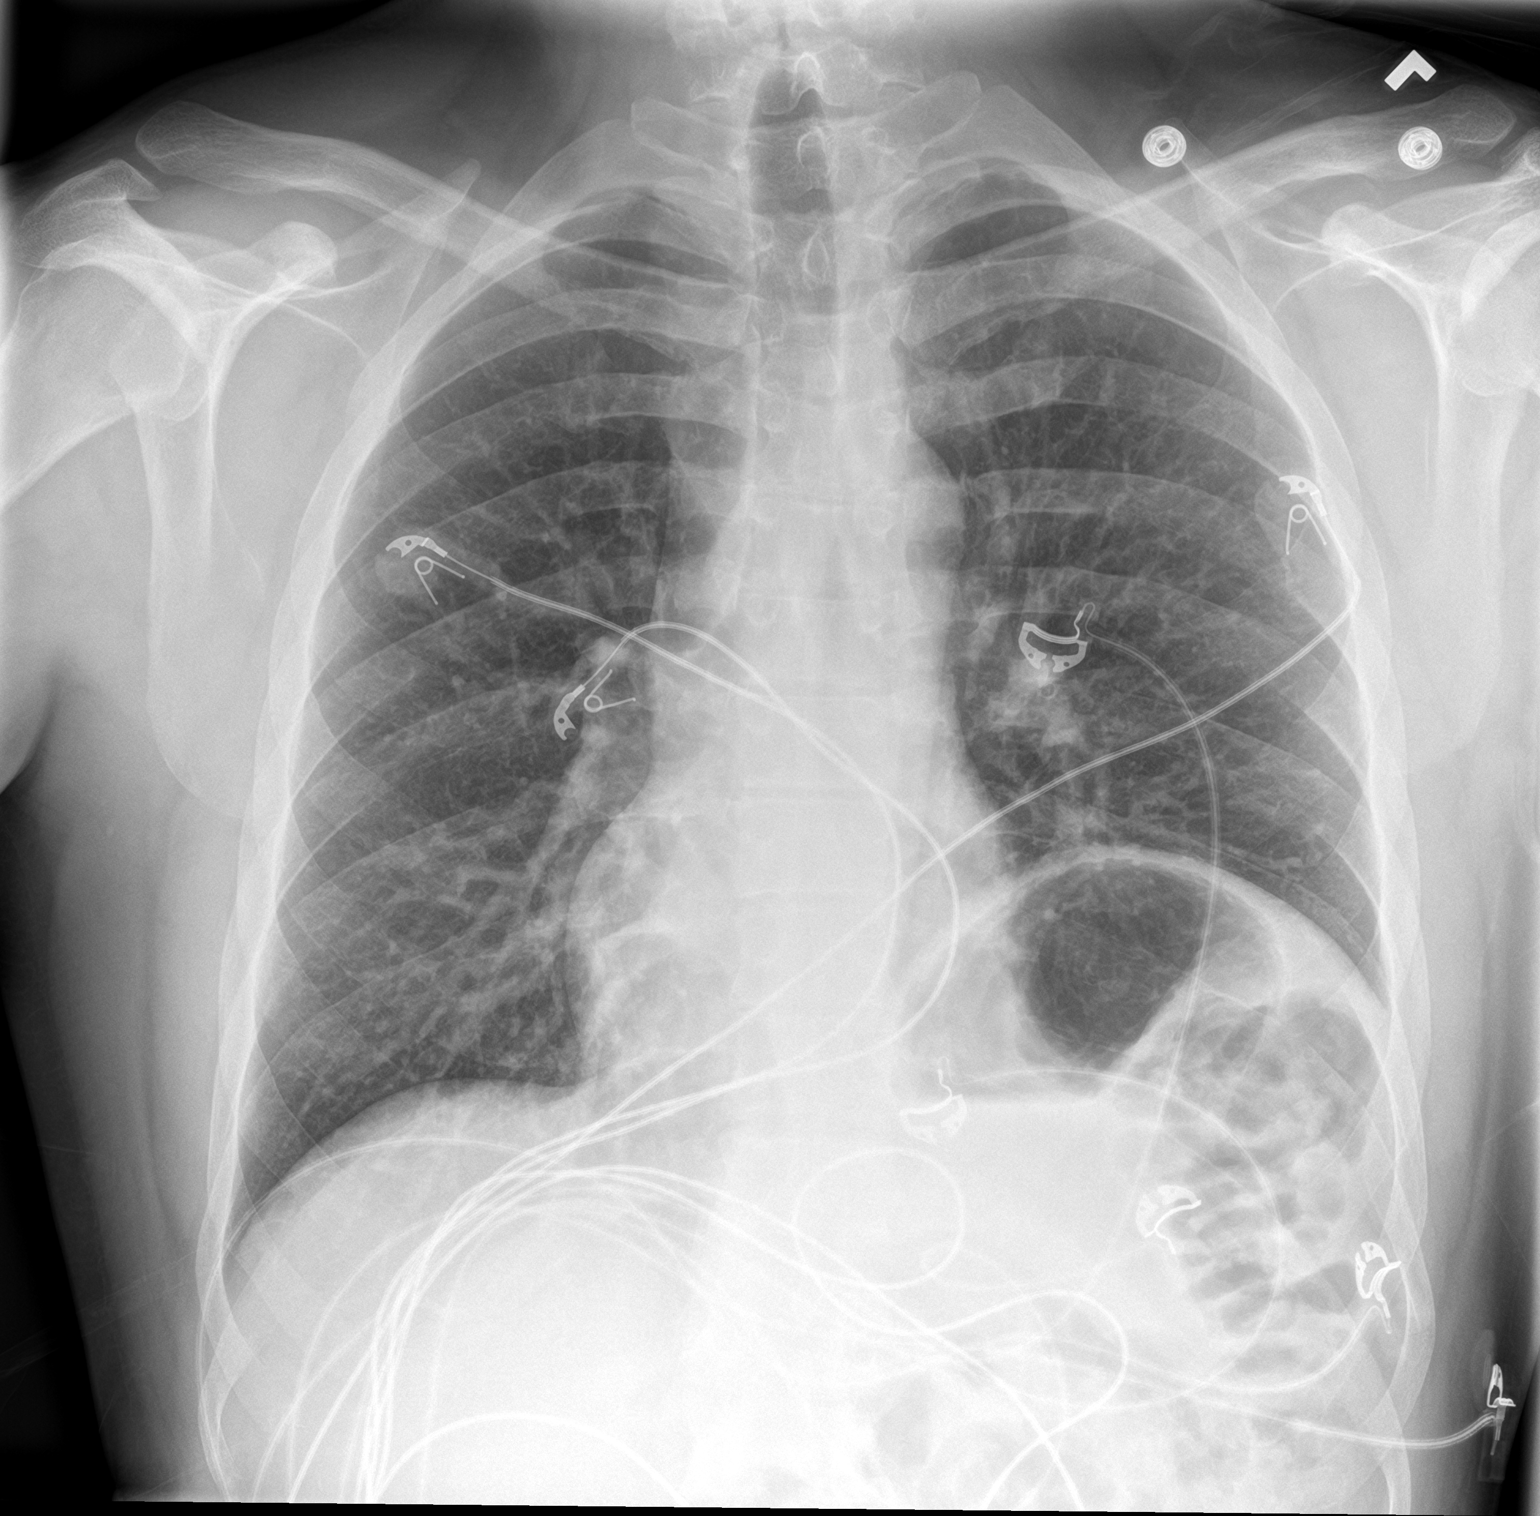

[chest lat]
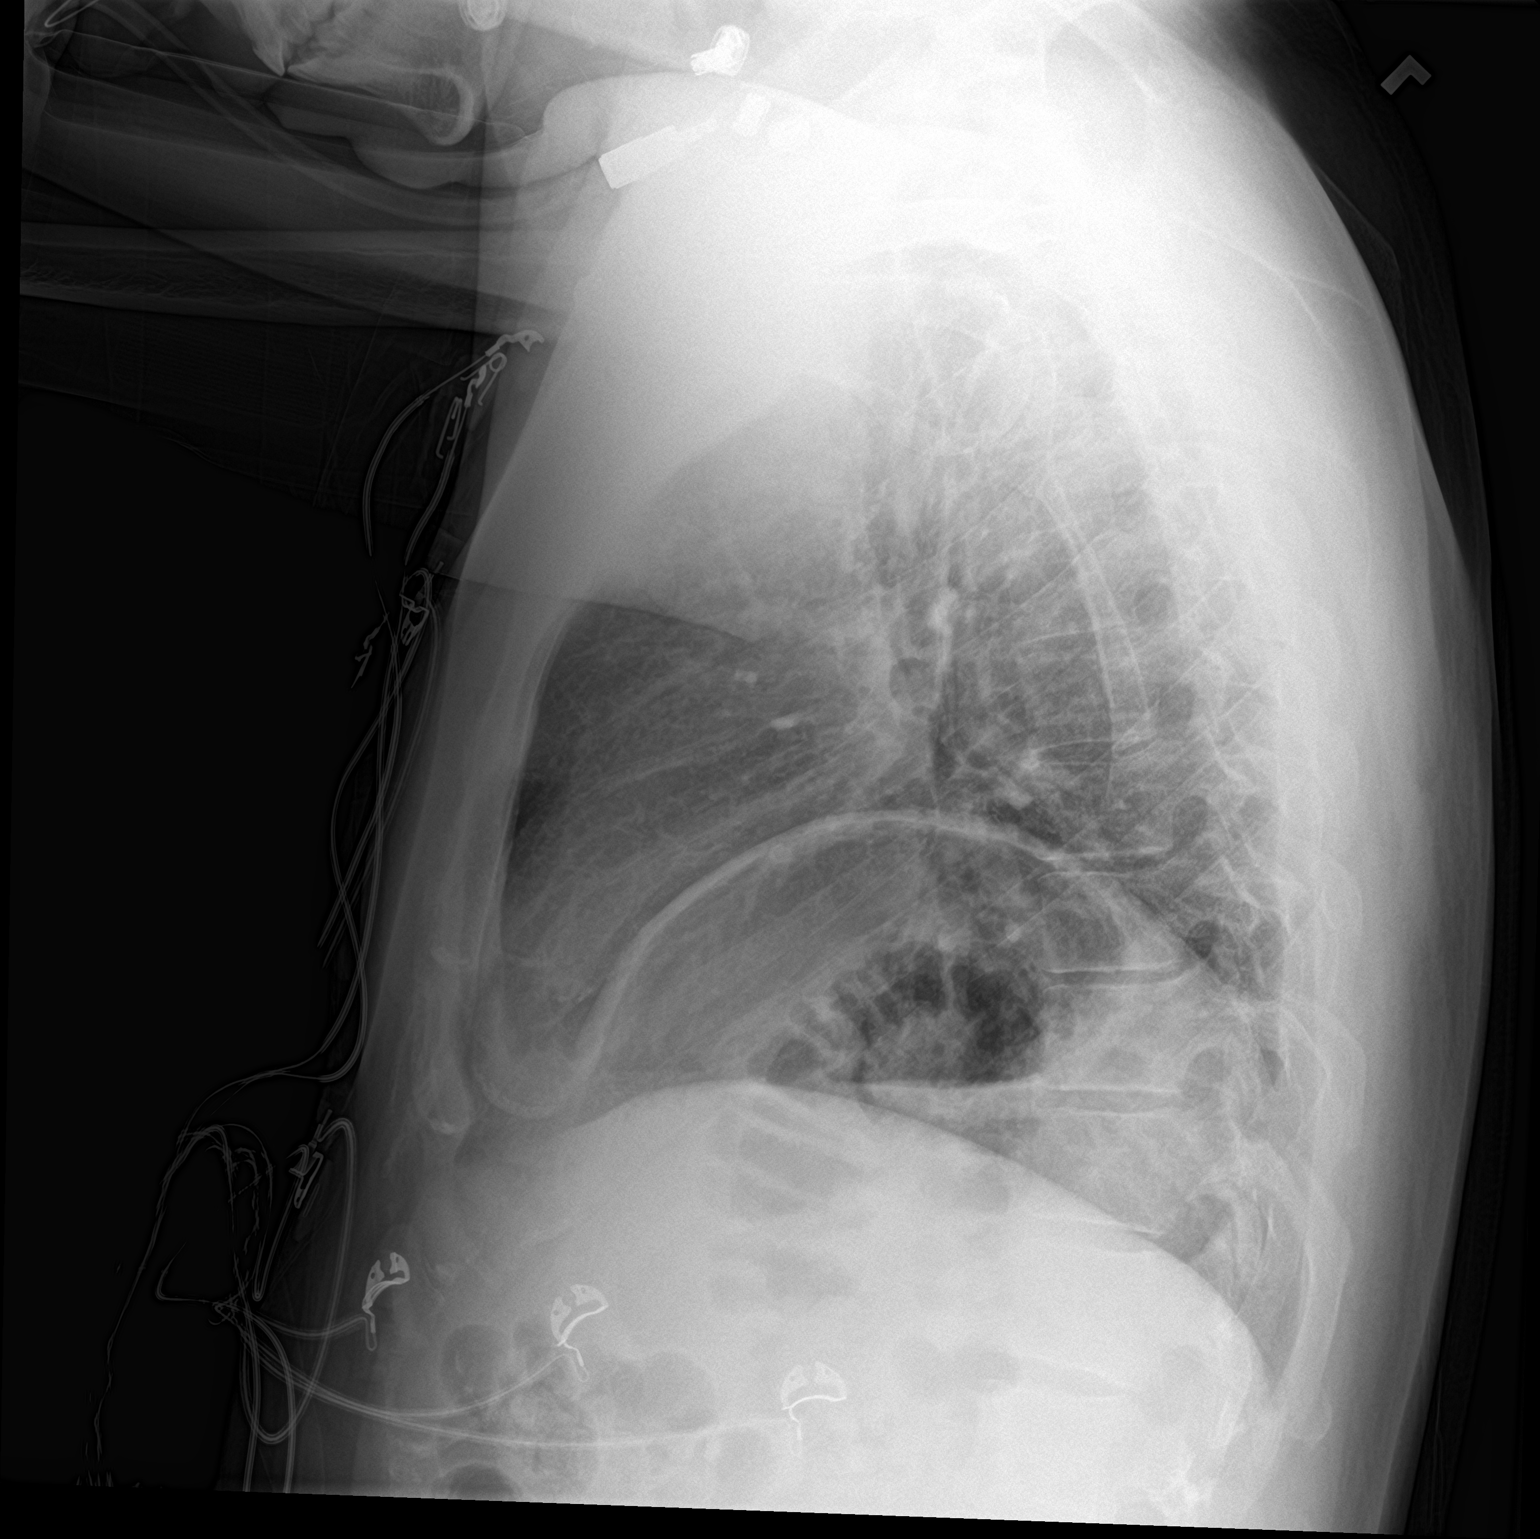

[2 of 2 positions shown; findings below may reference images not displayed]

FINDINGS: Cardiac and mediastinal silhouettes are within normal limits.

Elevation of the left hemidiaphragm. No focal infiltrates. No edema
or effusion. No pneumothorax.

No acute osseous finding.
IMPRESSION: 1. Elevation of the left hemidiaphragm.
2. No other active cardiopulmonary disease.

## 2022-09-22 ENCOUNTER — Encounter: Payer: Self-pay | Admitting: Radiology

## 2023-01-04 ENCOUNTER — Ambulatory Visit: Payer: BC Managed Care – PPO | Admitting: Urology

## 2023-02-03 ENCOUNTER — Ambulatory Visit: Payer: BC Managed Care – PPO | Admitting: Urology

## 2023-02-03 DIAGNOSIS — Z3009 Encounter for other general counseling and advice on contraception: Secondary | ICD-10-CM

## 2023-11-16 ENCOUNTER — Encounter (HOSPITAL_BASED_OUTPATIENT_CLINIC_OR_DEPARTMENT_OTHER): Payer: Self-pay | Admitting: Internal Medicine

## 2023-11-16 DIAGNOSIS — R5383 Other fatigue: Secondary | ICD-10-CM

## 2023-11-16 DIAGNOSIS — R0683 Snoring: Secondary | ICD-10-CM

## 2024-02-27 NOTE — Procedures (Signed)
 Orders only
